# Patient Record
Sex: Male | Born: 1970 | Race: White | Hispanic: No | Marital: Married
Health system: Southern US, Community
[De-identification: ages and names within clinical notes are randomized; demographics above are authoritative.]

---

## 2005-10-01 ENCOUNTER — Inpatient Hospital Stay: Payer: Self-pay | Admitting: Internal Medicine

## 2005-10-01 ENCOUNTER — Other Ambulatory Visit: Payer: Self-pay

## 2005-10-13 ENCOUNTER — Other Ambulatory Visit: Payer: Self-pay

## 2005-10-13 ENCOUNTER — Emergency Department: Payer: Self-pay | Admitting: Internal Medicine

## 2006-02-06 ENCOUNTER — Other Ambulatory Visit: Payer: Self-pay

## 2006-02-06 ENCOUNTER — Emergency Department: Payer: Self-pay | Admitting: General Practice

## 2006-05-26 ENCOUNTER — Other Ambulatory Visit: Payer: Self-pay

## 2006-05-26 ENCOUNTER — Inpatient Hospital Stay: Payer: Self-pay | Admitting: Internal Medicine

## 2006-05-31 ENCOUNTER — Emergency Department: Payer: Self-pay | Admitting: Emergency Medicine

## 2006-05-31 ENCOUNTER — Other Ambulatory Visit: Payer: Self-pay

## 2006-06-02 ENCOUNTER — Ambulatory Visit: Payer: Self-pay | Admitting: Family Medicine

## 2006-06-02 ENCOUNTER — Inpatient Hospital Stay: Payer: Self-pay | Admitting: Internal Medicine

## 2006-06-02 ENCOUNTER — Other Ambulatory Visit: Payer: Self-pay

## 2006-06-02 ENCOUNTER — Ambulatory Visit: Payer: Self-pay | Admitting: Internal Medicine

## 2006-06-26 ENCOUNTER — Emergency Department: Payer: Self-pay | Admitting: Emergency Medicine

## 2006-06-26 ENCOUNTER — Other Ambulatory Visit: Payer: Self-pay

## 2006-06-29 ENCOUNTER — Ambulatory Visit: Payer: Self-pay | Admitting: Internal Medicine

## 2006-07-25 ENCOUNTER — Inpatient Hospital Stay: Payer: Self-pay | Admitting: Internal Medicine

## 2006-07-30 ENCOUNTER — Emergency Department: Payer: Self-pay | Admitting: Unknown Physician Specialty

## 2006-07-30 ENCOUNTER — Other Ambulatory Visit: Payer: Self-pay

## 2006-07-30 ENCOUNTER — Ambulatory Visit: Payer: Self-pay | Admitting: Internal Medicine

## 2006-08-01 ENCOUNTER — Emergency Department: Payer: Self-pay | Admitting: Emergency Medicine

## 2006-08-02 ENCOUNTER — Emergency Department: Payer: Self-pay | Admitting: Unknown Physician Specialty

## 2006-09-26 ENCOUNTER — Emergency Department: Payer: Self-pay | Admitting: Emergency Medicine

## 2006-09-26 ENCOUNTER — Other Ambulatory Visit: Payer: Self-pay

## 2006-11-10 ENCOUNTER — Emergency Department: Payer: Self-pay | Admitting: Unknown Physician Specialty

## 2007-08-08 ENCOUNTER — Inpatient Hospital Stay: Payer: Self-pay | Admitting: Psychiatry

## 2007-11-09 ENCOUNTER — Emergency Department: Payer: Self-pay | Admitting: Emergency Medicine

## 2007-12-03 ENCOUNTER — Emergency Department: Payer: Self-pay | Admitting: Internal Medicine

## 2007-12-12 ENCOUNTER — Ambulatory Visit: Payer: Self-pay | Admitting: Physician Assistant

## 2007-12-27 ENCOUNTER — Emergency Department: Payer: Self-pay | Admitting: Unknown Physician Specialty

## 2008-01-08 ENCOUNTER — Emergency Department: Payer: Self-pay | Admitting: Unknown Physician Specialty

## 2008-01-24 ENCOUNTER — Inpatient Hospital Stay: Payer: Self-pay | Admitting: General Surgery

## 2008-05-21 IMAGING — CR DG CHEST 1V PORT
1 series · 1 of 1 positions shown · non-contrast
Comparison: none

REASON FOR EXAM: Chest pain
COMMENTS:

[view not recorded]
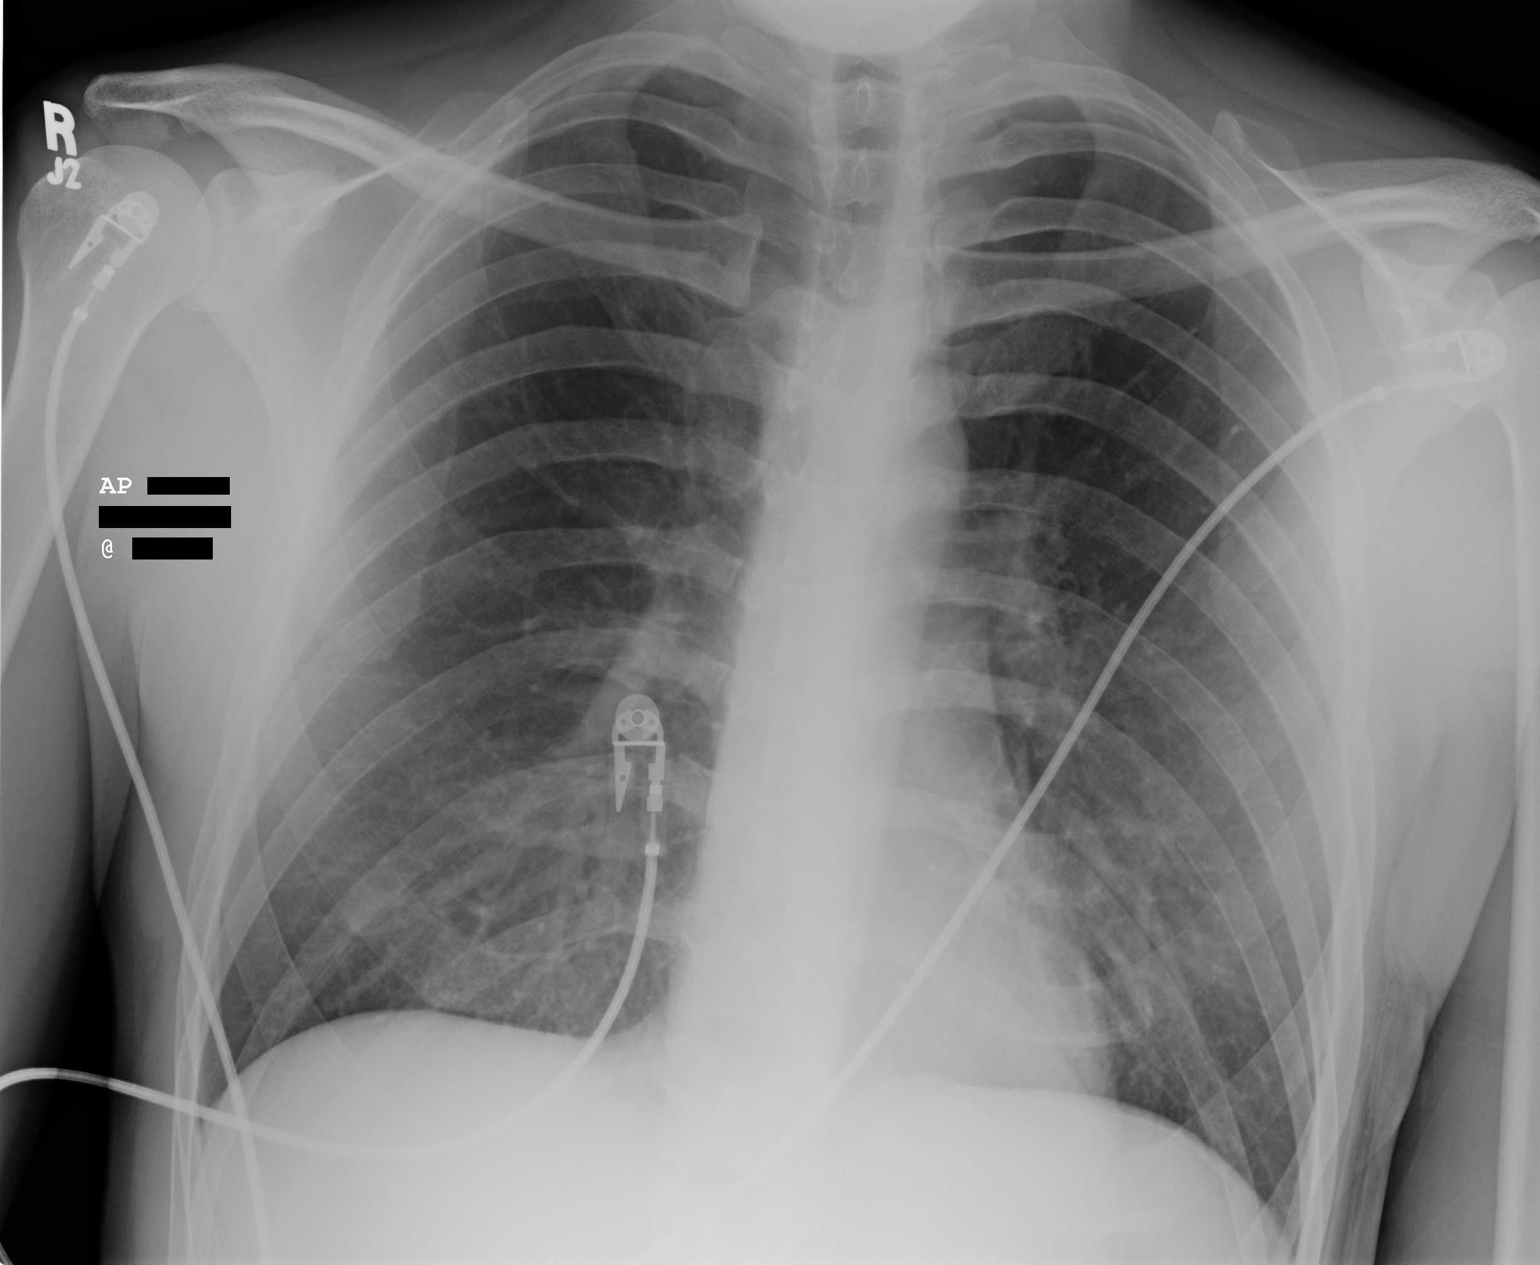

[1 of 1 positions shown; findings below may reference images not displayed]

PROCEDURE:     DXR - DXR PORTABLE CHEST SINGLE VIEW  - May 26, 2006  [DATE]

RESULT:     Comparison is made to a prior exam of 02/06/2006.

The lung fields are clear. No pneumonia, pneumothorax or pleural effusion is
seen. The chest is hyperexpanded suspicious for a history of reactive airway
disease.
IMPRESSION: 1.     The lung fields are clear.
2.     The heart size is normal.
3.     The chest appears mildly hyperexpanded.

## 2008-05-21 IMAGING — CT CT CHEST W/ CM
1 series · 15 of 32 positions shown, 19 images · non-contrast
Comparison: none

REASON FOR EXAM: hemoptysis
COMMENTS:

[Series 2: soft tissue · axial · 0.76mm/px · z∈[+579,+859]mm · 15 of 64 slices shown, 19 images]
[im 5/64  soft-tissue]
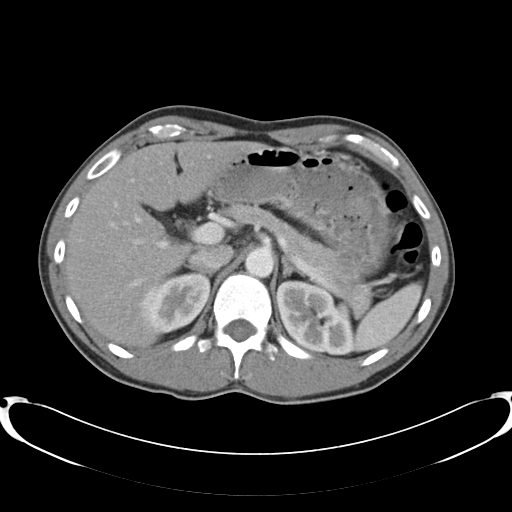
[im 5/64  bone]
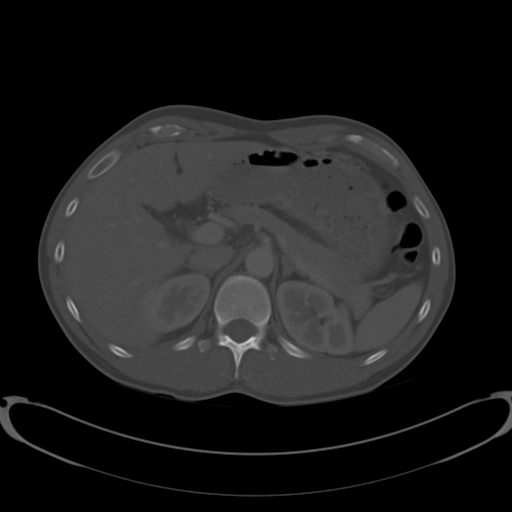
[im 9/64  soft-tissue]
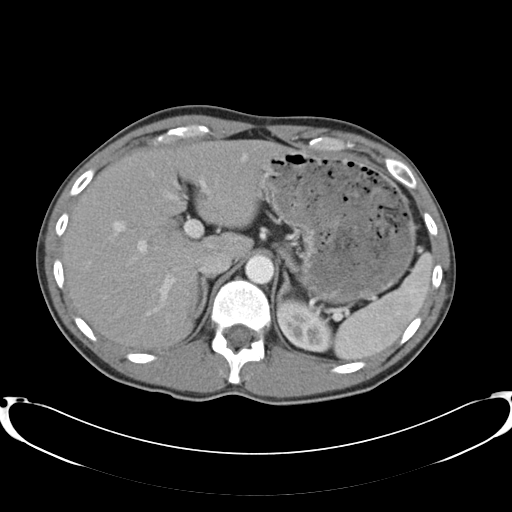
[im 13/64  soft-tissue]
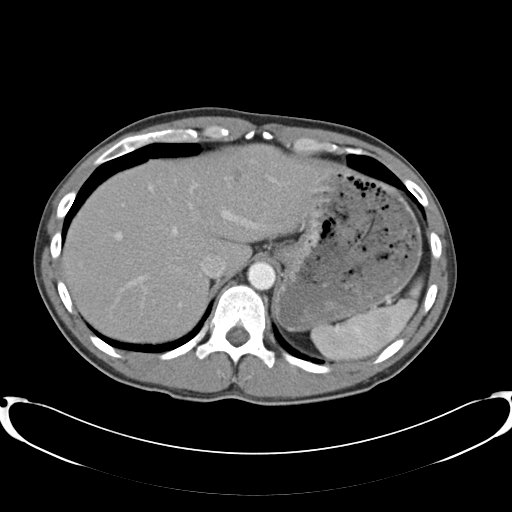
[im 19/64  soft-tissue]
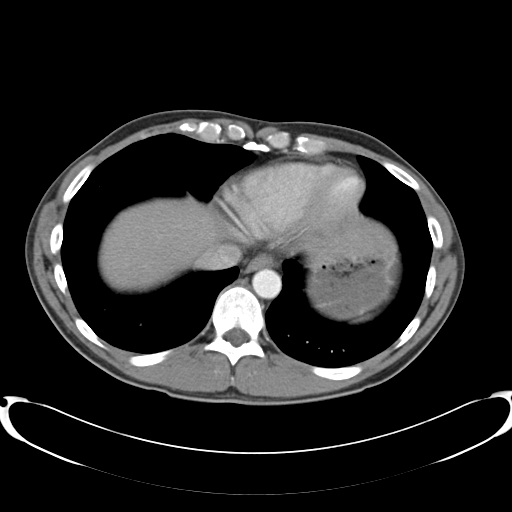
[im 23/64  soft-tissue]
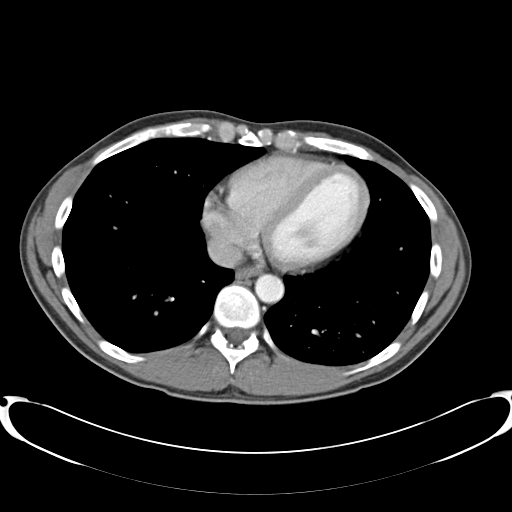
[im 27/64  soft-tissue]
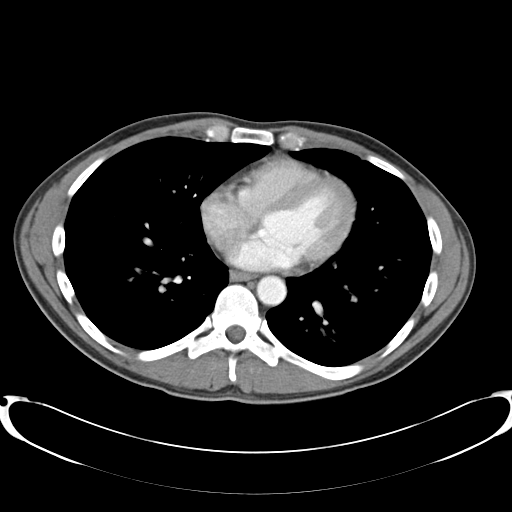
[im 33/64  soft-tissue]
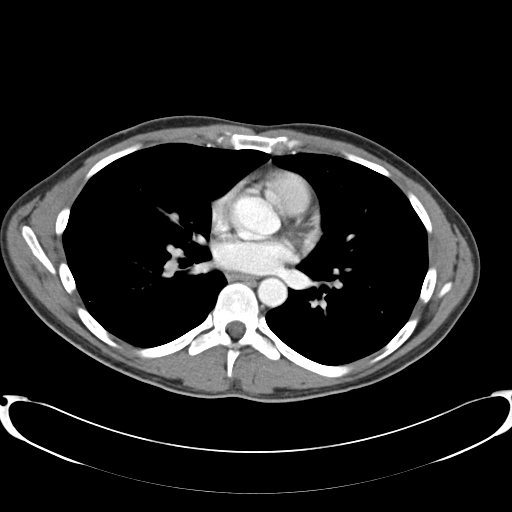
[im 37/64  soft-tissue]
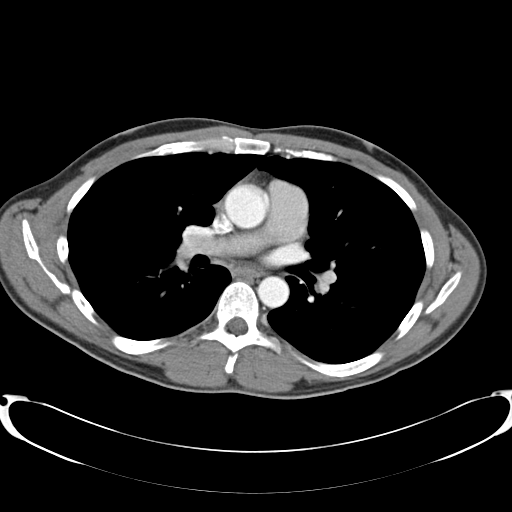
[im 41/64  soft-tissue]
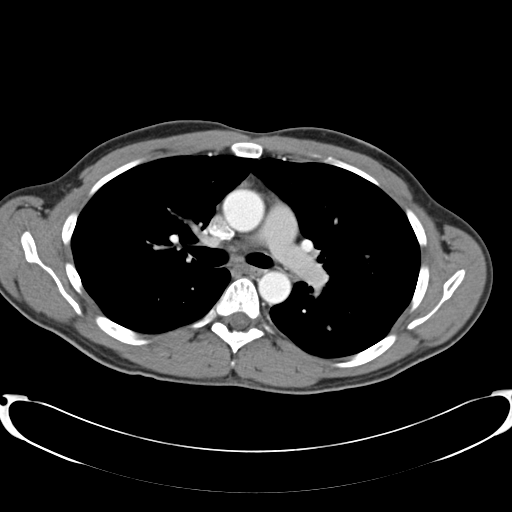
[im 41/64  bone]
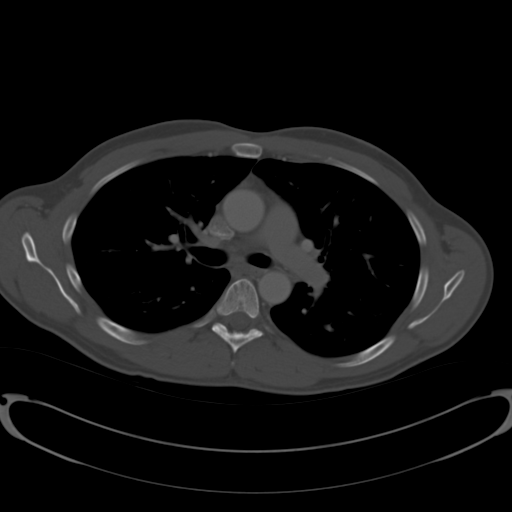
[im 45/64  soft-tissue]
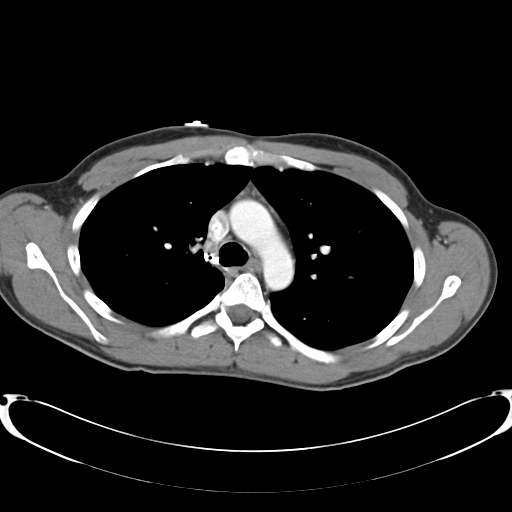
[im 51/64  soft-tissue]
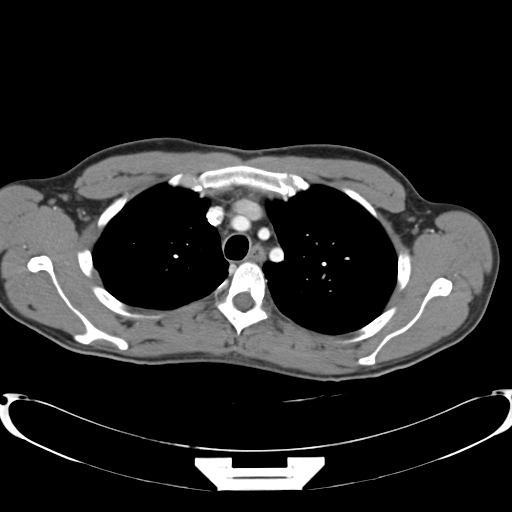
[im 55/64  soft-tissue]
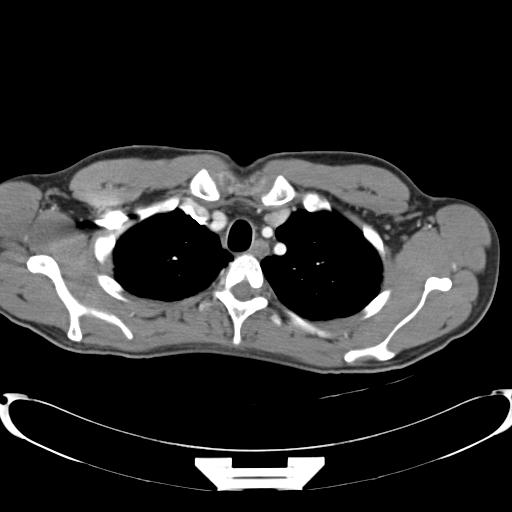
[im 55/64  lung]
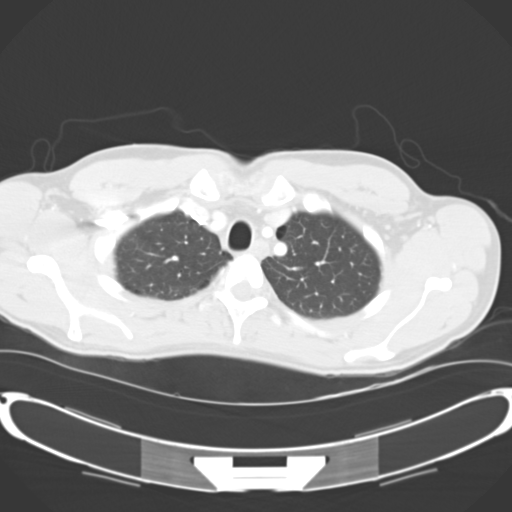
[im 57/64  lung]
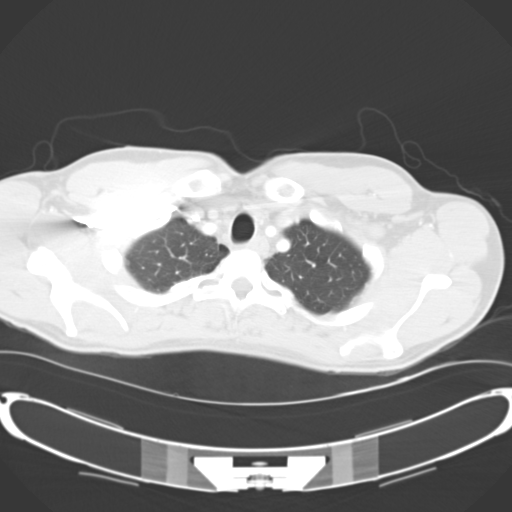
[im 59/64  soft-tissue]
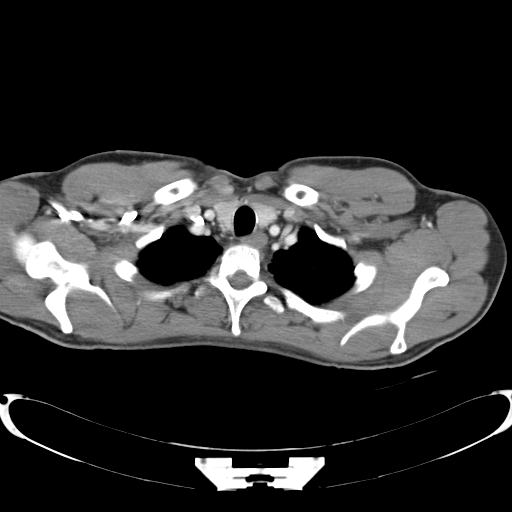
[im 59/64  lung]
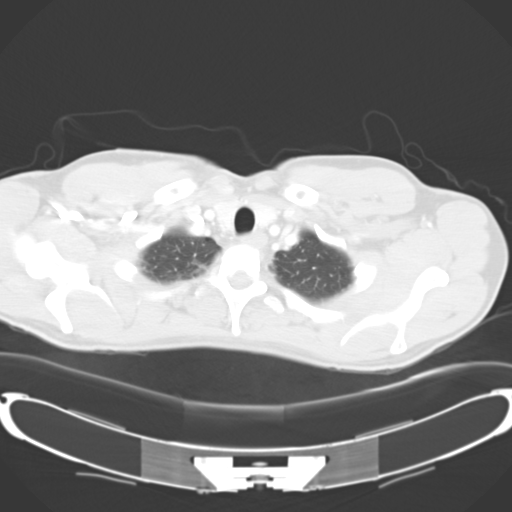
[im 61/64  lung]
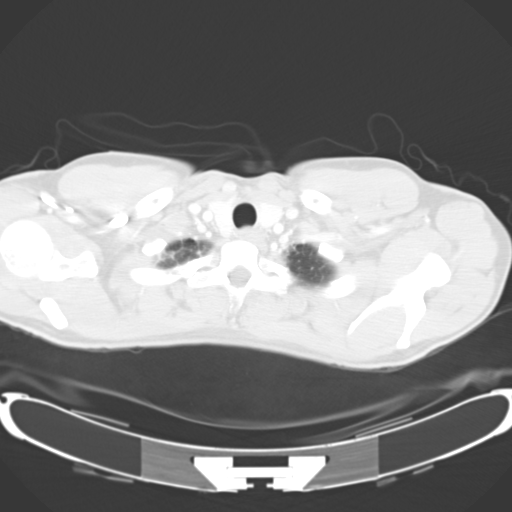

[15 of 32 positions shown; findings below may reference images not displayed]

PROCEDURE:     CT  - CT CHEST WITH CONTRAST  - May 26, 2006  [DATE]

RESULT:     IV contrast-enhanced chest CT is obtained.  The pulmonary
arteries are normal.  Heart size is normal. The adrenals are normal. Hepatic
and portal veins are normal. There is a pulmonary nodule noted in the
posterior aspect of the LEFT lung in what appears to be the LEFT upper lobe.
 This nodule is indeterminate.  A tiny malignancy could present in this
fashion.  It may be wise to perform a PET/CT for further evaluation.  A tiny
pinpoint nodule is noted in the upper lobe on the RIGHT.  This is
indeterminate and could represent a granuloma.  Metastatic disease cannot be
excluded.
IMPRESSION: 1.     Pulmonary nodules as described above.  There is a dominant LEFT upper
lobe pulmonary nodule measuring less than 1 cm.
2.     A tiny pinpoint nodule is noted in the RIGHT upper lobe.  It is
suggested that the patient undergo PET/CT for further evaluation.  Exam is
otherwise unremarkable.

This report was phoned to the patient's physician at the time of the study.

## 2008-05-26 IMAGING — CR DG CHEST 2V
1 series · 2 of 2 positions shown · non-contrast
Comparison: none

REASON FOR EXAM: chest pain, hemoptysis
COMMENTS:

PROCEDURE:     DXR - DXR CHEST PA (OR AP) AND LATERAL  - May 31, 2006 [DATE]
RESULT:     Comparison is made to study 26 May, 2006.
The lungs are hyperinflated. There is no focal infiltrate. The heart and
pulmonary vascularity are normal in appearance. There is no evidence of a
pleural effusion.

[Series 1: view not recorded · 0.17mm/px · 2 of 2 slices shown]
[im 1/2]
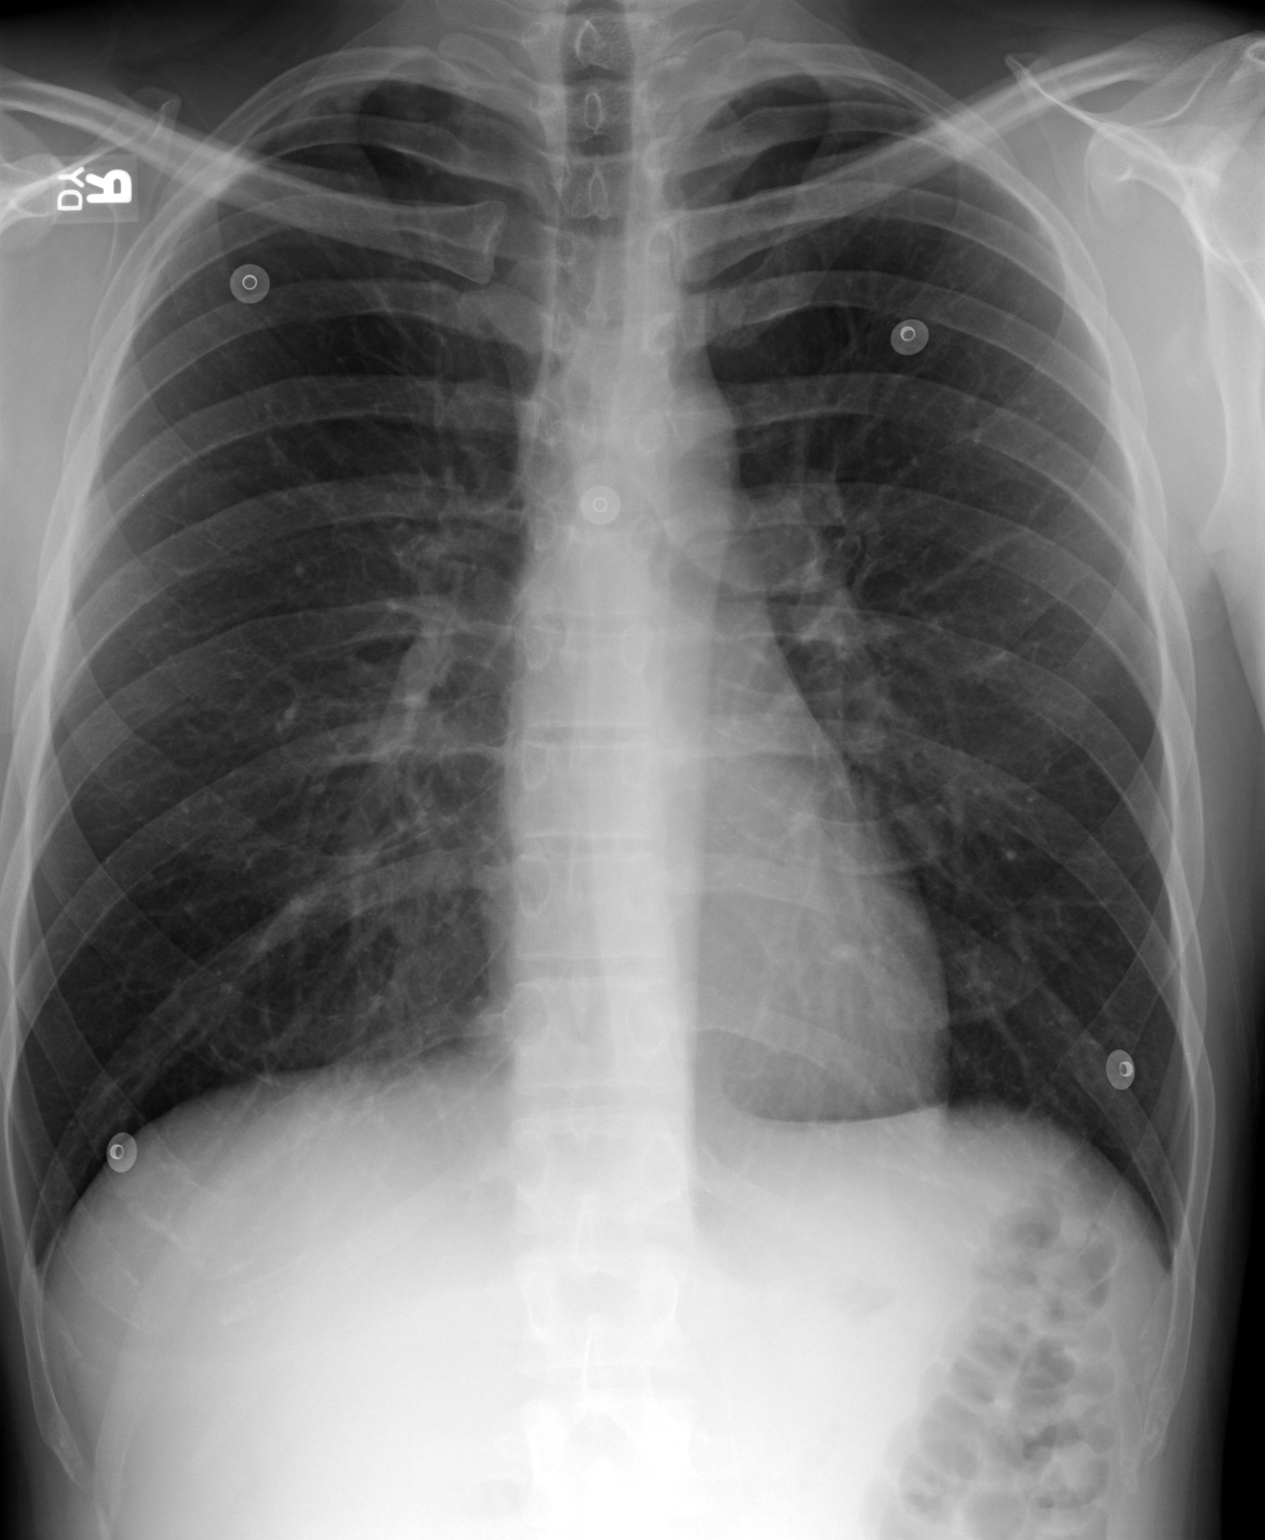
[im 2/2]
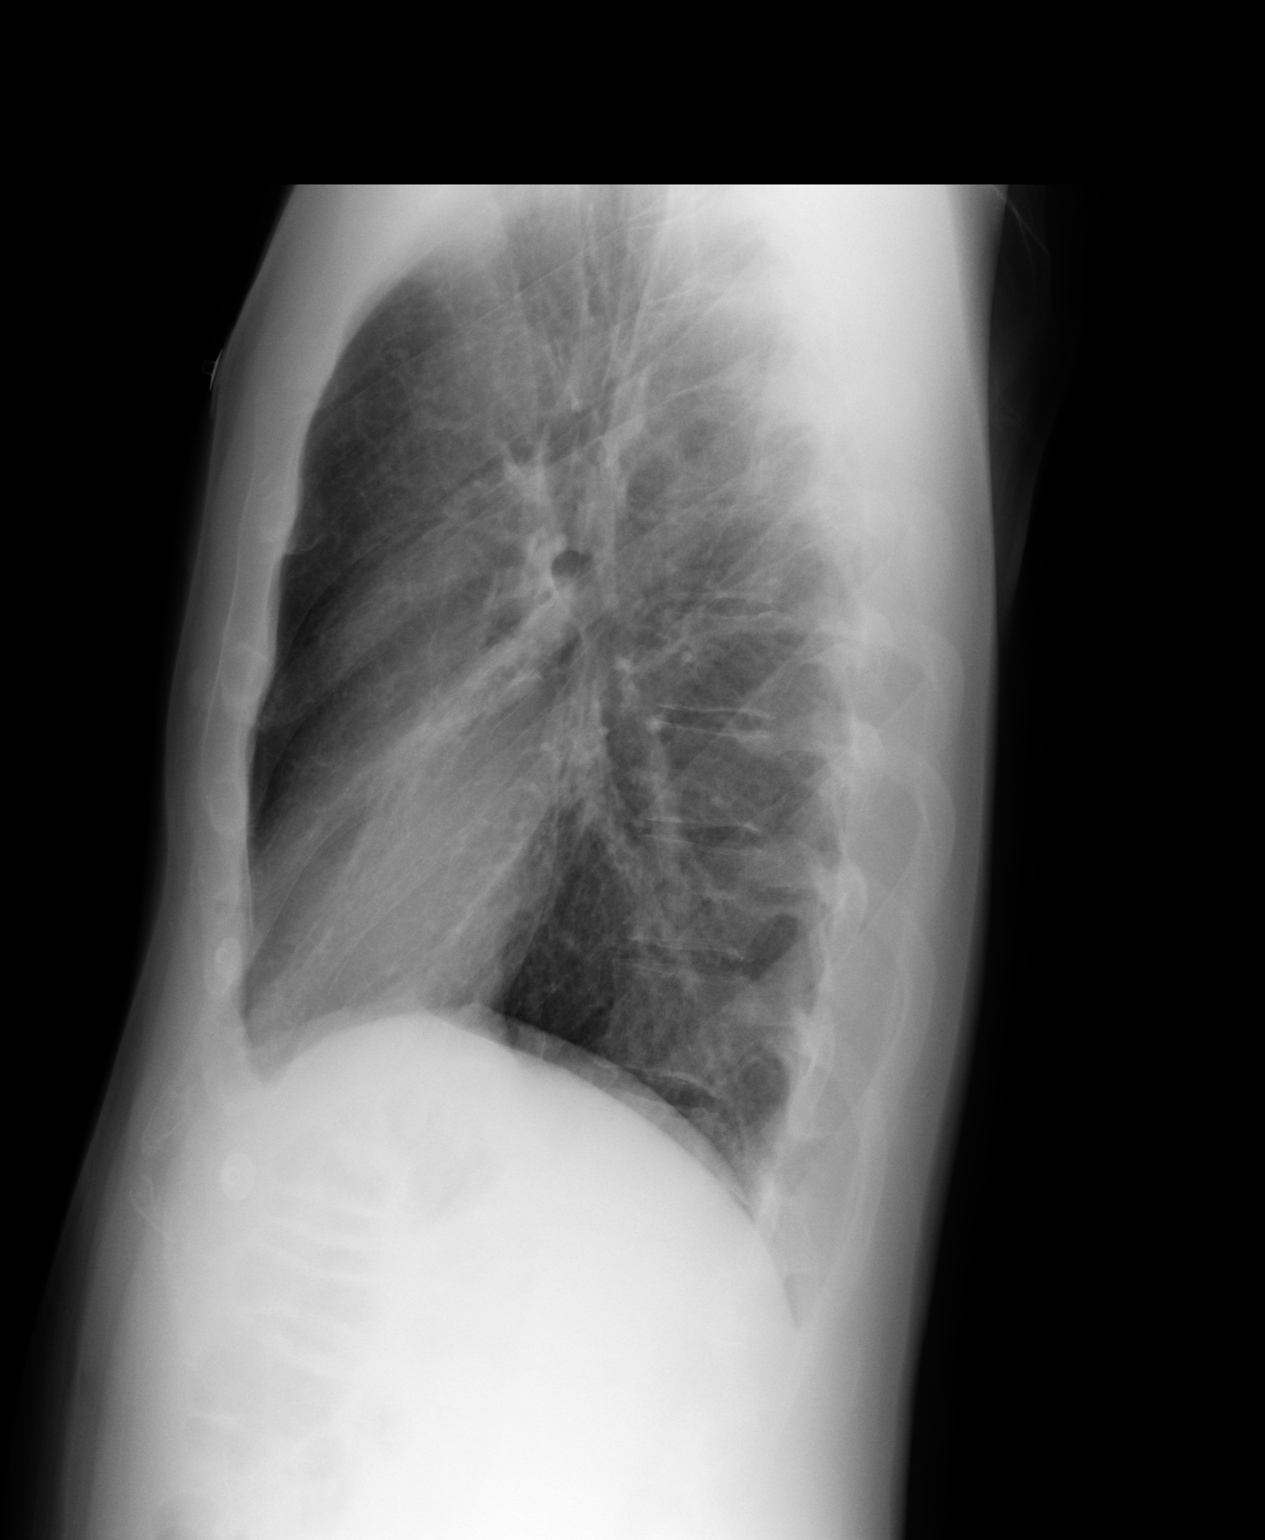

[2 of 2 positions shown; findings below may reference images not displayed]

IMPRESSION: 1.There remains mild hyperinflation. I do not see evidence of CHF nor of
pneumonia. I cannot exclude an element of acute bronchitis or reactive
airway disease.

## 2008-06-10 ENCOUNTER — Emergency Department: Payer: Self-pay | Admitting: Emergency Medicine

## 2008-06-14 ENCOUNTER — Emergency Department: Payer: Self-pay | Admitting: Emergency Medicine

## 2008-07-07 ENCOUNTER — Emergency Department: Payer: Self-pay | Admitting: Unknown Physician Specialty

## 2008-07-09 ENCOUNTER — Emergency Department: Payer: Self-pay | Admitting: Emergency Medicine

## 2008-07-16 ENCOUNTER — Emergency Department: Payer: Self-pay | Admitting: Emergency Medicine

## 2008-08-11 ENCOUNTER — Emergency Department: Payer: Self-pay | Admitting: Emergency Medicine

## 2008-09-04 ENCOUNTER — Emergency Department: Payer: Self-pay | Admitting: Emergency Medicine

## 2008-09-05 ENCOUNTER — Emergency Department: Payer: Self-pay | Admitting: Emergency Medicine

## 2008-09-06 ENCOUNTER — Emergency Department: Payer: Self-pay | Admitting: Emergency Medicine

## 2008-10-15 ENCOUNTER — Emergency Department: Payer: Self-pay | Admitting: Unknown Physician Specialty

## 2009-08-14 ENCOUNTER — Ambulatory Visit: Payer: Self-pay

## 2009-08-26 ENCOUNTER — Emergency Department: Payer: Self-pay | Admitting: Emergency Medicine

## 2009-12-25 ENCOUNTER — Emergency Department: Payer: Self-pay | Admitting: Emergency Medicine

## 2011-05-29 ENCOUNTER — Emergency Department: Payer: Self-pay | Admitting: *Deleted

## 2011-05-29 LAB — URINALYSIS, COMPLETE
Glucose,UR: NEGATIVE mg/dL (ref 0–75)
Hyaline Cast: 36
Ketone: NEGATIVE
Leukocyte Esterase: NEGATIVE
Nitrite: NEGATIVE
Specific Gravity: 1.03 (ref 1.003–1.030)
Squamous Epithelial: NONE SEEN
WBC UR: 6 /HPF (ref 0–5)

## 2011-05-29 LAB — COMPREHENSIVE METABOLIC PANEL
Albumin: 3.5 g/dL (ref 3.4–5.0)
Alkaline Phosphatase: 105 U/L (ref 50–136)
Anion Gap: 11 (ref 7–16)
BUN: 15 mg/dL (ref 7–18)
Bilirubin,Total: 0.6 mg/dL (ref 0.2–1.0)
Calcium, Total: 9 mg/dL (ref 8.5–10.1)
Chloride: 105 mmol/L (ref 98–107)
Co2: 24 mmol/L (ref 21–32)
EGFR (Non-African Amer.): >60
Glucose: 153 mg/dL — ABNORMAL HIGH (ref 65–99)
Potassium: 3.6 mmol/L (ref 3.5–5.1)
SGOT(AST): 549 U/L — ABNORMAL HIGH (ref 15–37)
Sodium: 140 mmol/L (ref 136–145)
Total Protein: 7.3 g/dL (ref 6.4–8.2)

## 2011-05-29 LAB — DRUG SCREEN, URINE
Benzodiazepine, Ur Scrn: NEGATIVE (ref ?–200)
Cocaine Metabolite,Ur ~~LOC~~: NEGATIVE (ref ?–300)
MDMA (Ecstasy)Ur Screen: NEGATIVE (ref ?–500)

## 2011-05-29 LAB — VALPROIC ACID LEVEL: Valproic Acid: <3 ug/mL — ABNORMAL LOW

## 2011-05-29 LAB — PROTIME-INR
INR: 1.1
Prothrombin Time: 14.5 secs (ref 11.5–14.7)

## 2011-05-29 LAB — CBC
MCH: 29.7 pg (ref 26.0–34.0)
MCV: 89 fL (ref 80–100)
Platelet: 237 10*3/uL (ref 150–440)
RBC: 5.14 10*6/uL (ref 4.40–5.90)
WBC: 12.2 10*3/uL — ABNORMAL HIGH (ref 3.8–10.6)

## 2011-05-29 LAB — TROPONIN I: Troponin-I: >40 ng/mL

## 2011-05-30 DEATH — deceased

## 2014-06-22 NOTE — H&P (Signed)
PATIENT NAME:  [name], [name] MR#:  [id number] DATE OF BIRTH:  20-Nov-1970  DATE OF ADMISSION:  06-15-11  PRIMARY CARE PHYSICIAN: Dr. Dr [name] at [facility]    CHIEF COMPLAINT: Chest pain.   HISTORY OF PRESENT ILLNESS: This is a 44 year old man with history of discoid lupus, cerebrovascular accident and heart disease around five years ago. He was not feeling well yesterday, had left arm hurting, shortness of breath and chest pain. He describes the pain as a sharp pressure pain, 10/10 in intensity in the center of his chest, radiating to left arm. He actually passed out last night and was looking pale. He has been coughing up bloody phlegm, having a fever and sweats. He has also had chest pain; 1 or 2 weeks ago had chest pain and shortness of breath at that time also. In the Emergency Room he was found to be in acute respiratory failure requiring BiPAP to oxygenate. Chest x-ray was read as bilateral pulmonary edema, atypical pneumonia not excluded. His troponin was greater than 40, diluted out to 120. BNP elevated at 2702. Hospitalist services were contacted for further evaluation.   PAST MEDICAL HISTORY:  1. Discoid lupus. 2. Cerebrovascular accident. 3. Heart disease. 4. Hypertension. 5. Depression.   PAST SURGICAL HISTORY:  1. Right hand surgery an. 2. Multiple knee surgeries.  3. Appendectomy.  4. Abscess on the groin. 5. Tonsillectomy.   ALLERGIES: Sulfa and penicillin makes his mouth swell and itching.   MEDICATIONS:  1. Prozac 60 mg daily.  2. Chlorthalidone 50 mg daily.  3. Promethazine p.r.n.  4. He does take intermittent prednisone.   SOCIAL HISTORY: Positive smoker ,1 pack lasts him three days. 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 He works in a [employer], [employer].   FAMILY HISTORY: Mother with lupus. Father with heart disease.    REVIEW OF SYSTEMS: CONSTITUTIONAL: Positive for fever. Positive for sweats. No weight loss. No weight gain. Positive for fatigue.  EYES: He does wear glasses. EARS, NOSE, MOUTH, AND THROAT: No hearing loss. No sore throat. No difficulty swallowing. CARDIOVASCULAR: Positive for chest pain. Positive for palpitations. RESPIRATORY: Positive for shortness of breath. Positive for coughing up bloody brown phlegm. Positive for wheeze. GASTROINTESTINAL: Positive for nausea, vomiting. No abdominal pain. Positive for diarrhea. GENITOURINARY: Positive for dark urine. No hematuria. MUSCULOSKELETAL: Positive for left arm pain. INTEGUMENT: Positive for rash on the left knee and face and body from the lupus. NEUROLOGIC: Fainted last night. ENDOCRINE: No thyroid problems. HEMATOLOGIC/LYMPHATIC: No anemia. No easy bruising or bleeding.   PHYSICAL EXAMINATION: VITAL SIGNS: Temperature 101.2, pulse 134, respirations 22, blood pressure 121/70, pulse oximetry 95% on BiPAP. On presentation to the ER patient had a temperature of 101.2, pulse 126, respirations 20, pulse oximetry 89% on room air, blood pressure 106/64. When I saw him he did take his BiPAP off for a few minutes while vomiting and spitting up, pulse oximetry dropped down to 82% on room air.   GENERAL: Slight respiratory distress.   EYES: Conjunctivae and lids normal. Pupils equal, round, and reactive to light. Extraocular muscles intact. No nystagmus.   EARS, NOSE, MOUTH, AND THROAT: Tympanic membranes no erythema. Nasal mucosa no erythema.   THROAT: Slight erythema. No exudate seen. Lips and gums no lesions.   NECK: Positive for JVD. No bruits. No lymphadenopathy. No thyromegaly. No thyroid nodules palpated.   RESPIRATORY: Decreased breath sounds bilaterally. Poor air entry. Positive wheeze and rhonchi throughout entire lung field.   CARDIOVASCULAR: S1, S2 tachycardic.  No gallops, rubs, or murmurs heard. Carotid upstroke 2+ bilaterally. No bruits.   EXTREMITIES: Dorsalis pedis pulses 2+ bilaterally. No edema to lower extremity.   ABDOMEN: Soft, nontender. No  organomegaly/splenomegaly. Normoactive bowel sounds. No masses felt.   LYMPHATIC: No lymph nodes in the neck.   MUSCULOSKELETAL: No clubbing, edema, or cyanosis.   SKIN: Left knee erythematous and scaling. Arms and face reddish scaling lesions that seem to be chronic for the patient. Back of the neck looks like acne.   PSYCHIATRIC: Patient is oriented to person, place, and time. Trouble talking secondary to respiratory failure.   LABORATORY, DIAGNOSTIC, AND RADIOLOGICAL DATA: First EKG showed sinus tachycardia, 125 beats per minute, left atrial enlargement, septal infarct, age undetermined. Second EKG showed sinus tachycardia, left atrial enlargement, low voltage septal infarct. PT 14.5, INR 1.1, PTT 31.3. ABG showed pH 7.29, pCO2 43, pO2 61 that is on 60% FiO2, oxygen saturation 87.8, bicarbonate 20.7. Urinalysis: 2+ bilirubin, 75 mg/dL protein, 1+ bacteria but nitrites and leukocyte esterase were negative. Urine toxicology positive for opiate. Chest x-ray read as no lobar pneumonia, bilateral pulmonary edema worse on the right without cardiomegaly, correlate for acute cardiac event, atypical bilateral pneumonia not excluded. Glucose 153, BUN 15, creatinine 1.33, sodium 140, potassium 3.6, chloride 105, CO2 24, AST elevated at 549, ALT normal, alkaline phosphatase 105, white blood cell count 12.2, hemoglobin and hematocrit 15.3 and 45.6, platelet count 237, troponin greater than 40 diluted down to 120. BNP 2702, valproic acid less than 3. CPK 3707, CK-MB 248.5.   ASSESSMENT AND PLAN:  1. Acute respiratory failure requiring BiPAP to oxygenate, most likely combination of acute myocardial infarction and congestive heart failure and possibility of pneumonia causing this. Continue BiPAP supplementation of oxygen.  2. Acute myocardial infarction with continued chest pain. Aspirin already given, will continue that on a daily basis. Will start nitroglycerin drip, heparin drip. Admit to the Critical Care Unit  for titration of nitroglycerin drip depending on blood pressure. Case discussed with Dr. Gwen Hernandez, will need cardiac catheterization. Hold off on beta blocker at this time with bronchospasm and respiratory failure.  3. Acute congestive heart failure, most likely secondary to myocardial infarction. Will give a dose of Lasix STAT and daily. Nitroglycerin drip should help. Will obtain an echocardiogram portable.  4. Systemic inflammatory response syndrome with possible pneumonia and fever, tachycardia, and tachypnea. Will give Levaquin and aztreonam since the patient has allergy to penicillin. Will get blood cultures x2. Will also start Solu-Medrol and obtain a sputum culture if possible.  5. Tachycardia, most likely with respiratory failure and congestive heart failure. Will monitor closely. Hold off on treatment at this point.  6. History of cerebrovascular accident. Will continue aspirin.  7. Hypertension. Will have to watch blood pressure closely on nitroglycerin drip. Will hold chlorthalidone at this time.  8. Depression. Continue Prozac.  9. Tobacco abuse. Smoking cessation counseling done, three minutes by me. Nicotine patch applied.  10. Patient is admitted to the Intensive Care Unit.   TIME SPENT ON PATIENT: 60 minutes critical care time.   ____________________________ Herschell Dimesichard J. Renae GlossWieting, MD rjw:cms Hernandez: 2011/03/11 11:26:01 ET T: 2011/03/11 12:06:19 ET JOB#: [id number]  cc: Herschell Dimesichard J. Renae GlossWieting, MD, <Dictator> Dr. Duard [facility]enSegar at [facility] Salley ScarletICHARD J Sariah Henkin MD ELECTRONICALLY SIGNED 2011/08/03 17:05
